# Patient Record
Sex: Female | Born: 1990
Health system: Southern US, Community
[De-identification: ages and names within clinical notes are randomized; demographics above are authoritative.]

---
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
## 2009-08-30 ENCOUNTER — Emergency Department (HOSPITAL_COMMUNITY): Admission: EM | Admit: 2009-08-30 | Discharge: 2009-08-31 | Payer: Self-pay | Admitting: Emergency Medicine

## 2009-08-30 ENCOUNTER — Encounter: Payer: Self-pay | Admitting: Orthopedic Surgery

## 2009-09-02 ENCOUNTER — Ambulatory Visit: Payer: Self-pay | Admitting: Orthopedic Surgery

## 2009-09-02 DIAGNOSIS — S93409A Sprain of unspecified ligament of unspecified ankle, initial encounter: Secondary | ICD-10-CM | POA: Insufficient documentation

## 2009-09-02 DIAGNOSIS — J45909 Unspecified asthma, uncomplicated: Secondary | ICD-10-CM | POA: Insufficient documentation

## 2010-04-21 IMAGING — CR DG ANKLE COMPLETE 3+V*R*
3 series · 3 of 3 positions shown · non-contrast
Comparison: None

CLINICAL DATA: Status post fall, with twisting injury to right
ankle; anterior and lateral right ankle pain.

RIGHT ANKLE - COMPLETE 3+ VIEW

[view not recorded (1 of 3)]
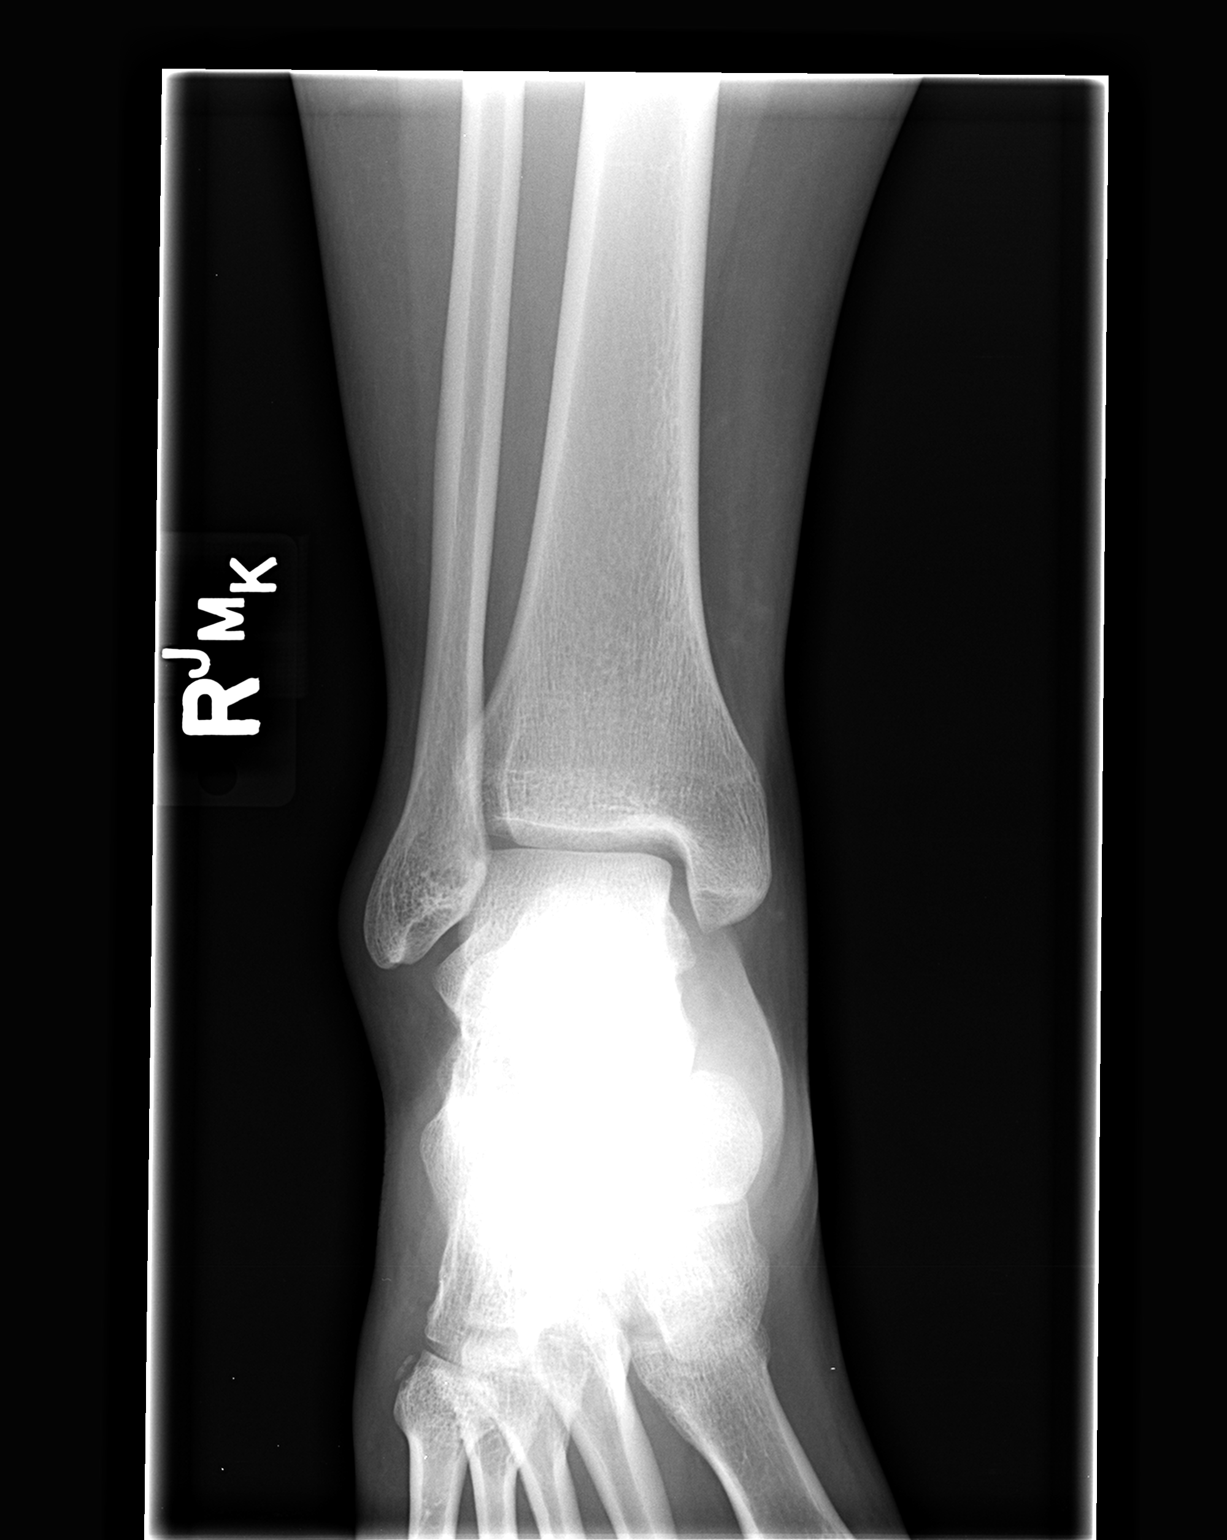

[view not recorded (2 of 3)]
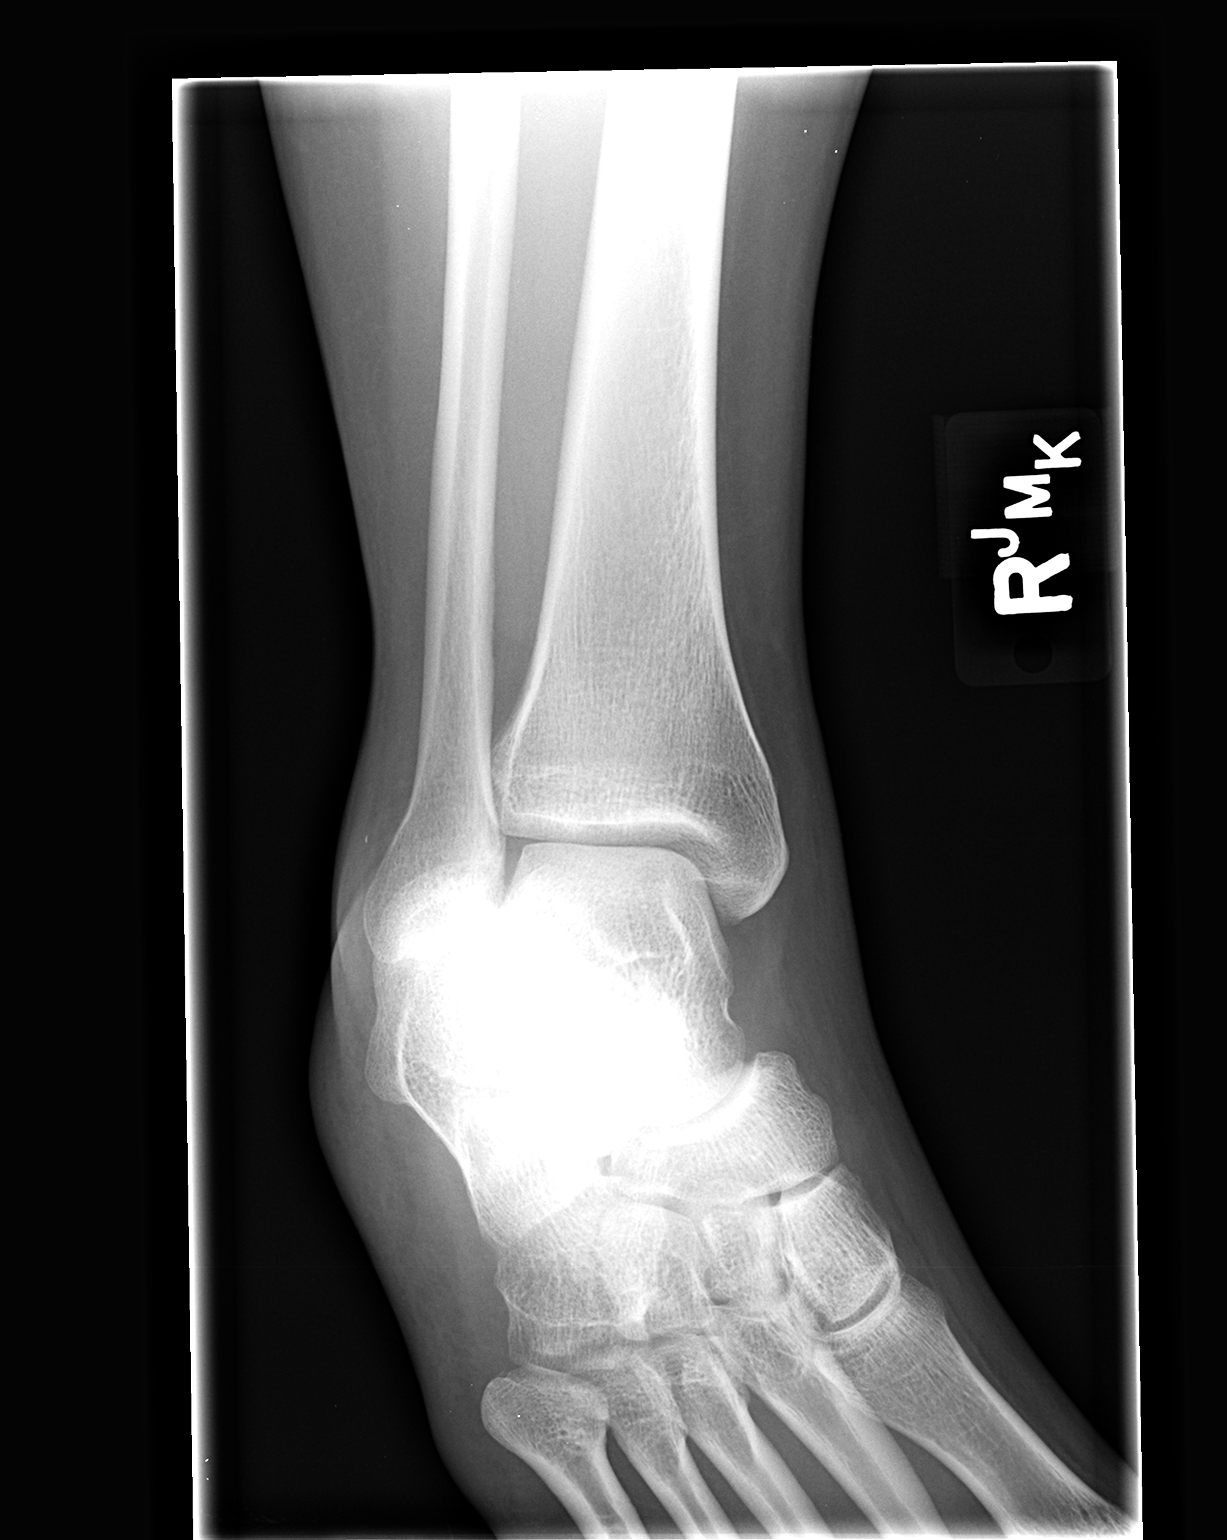

[view not recorded (3 of 3)]
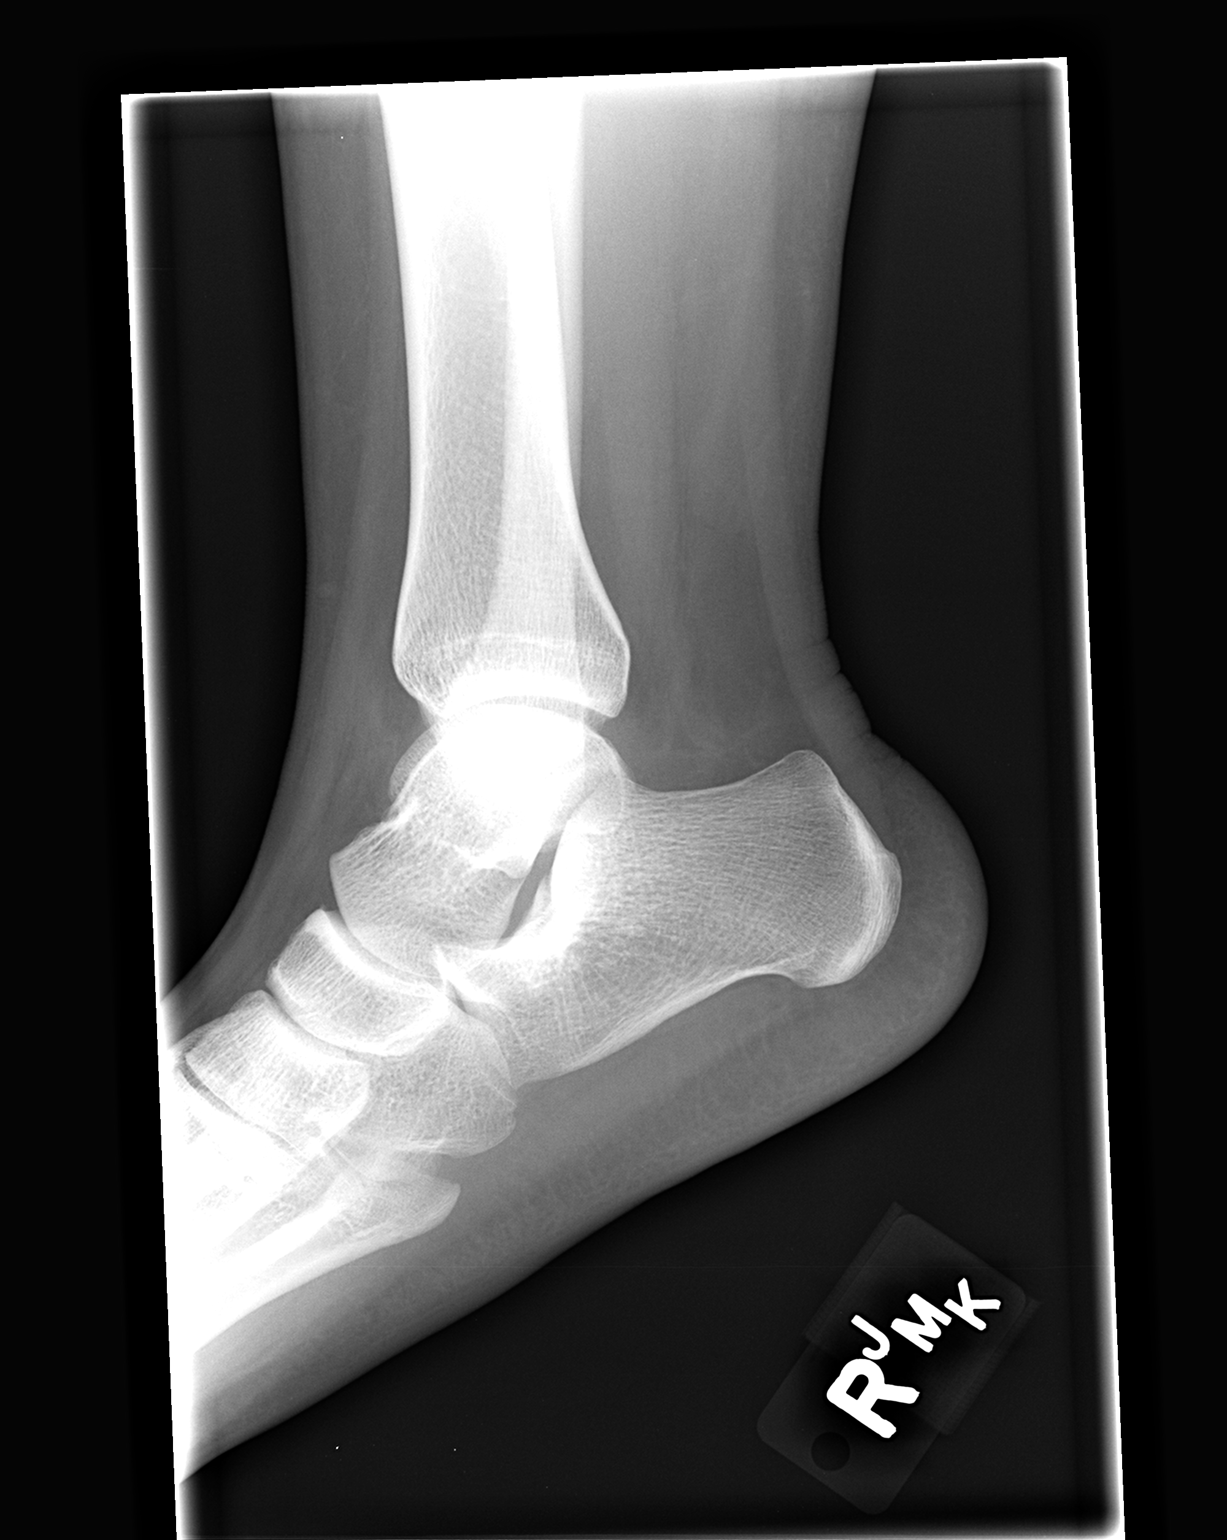

[3 of 3 positions shown; findings below may reference images not displayed]

FINDINGS: There is no evidence of acute fracture or dislocation.
The ankle mortise is intact; the interosseous space is within
normal limits.  No talar tilt or subluxation is seen. A small
osseous fragment noted at the lateral aspect of the base of the
fifth metatarsal likely reflects prior injury.

The joint spaces are preserved.  No significant soft tissue
abnormalities are seen.
IMPRESSION: No evidence of acute fracture or dislocation.

## 2018-03-20 DIAGNOSIS — Z6837 Body mass index (BMI) 37.0-37.9, adult: Secondary | ICD-10-CM | POA: Diagnosis not present

## 2018-03-20 DIAGNOSIS — J060 Acute laryngopharyngitis: Secondary | ICD-10-CM | POA: Diagnosis not present

## 2018-07-23 DIAGNOSIS — J029 Acute pharyngitis, unspecified: Secondary | ICD-10-CM | POA: Diagnosis not present

## 2018-07-23 DIAGNOSIS — J060 Acute laryngopharyngitis: Secondary | ICD-10-CM | POA: Diagnosis not present

## 2018-07-23 DIAGNOSIS — Z6837 Body mass index (BMI) 37.0-37.9, adult: Secondary | ICD-10-CM | POA: Diagnosis not present

## 2018-10-23 DIAGNOSIS — Z01419 Encounter for gynecological examination (general) (routine) without abnormal findings: Secondary | ICD-10-CM | POA: Diagnosis not present

## 2018-10-23 DIAGNOSIS — Z6841 Body Mass Index (BMI) 40.0 and over, adult: Secondary | ICD-10-CM | POA: Diagnosis not present

## 2018-10-23 DIAGNOSIS — N871 Moderate cervical dysplasia: Secondary | ICD-10-CM | POA: Diagnosis not present

## 2019-01-01 DIAGNOSIS — R112 Nausea with vomiting, unspecified: Secondary | ICD-10-CM | POA: Diagnosis not present

## 2019-01-01 DIAGNOSIS — K529 Noninfective gastroenteritis and colitis, unspecified: Secondary | ICD-10-CM | POA: Diagnosis not present

## 2019-01-01 DIAGNOSIS — R509 Fever, unspecified: Secondary | ICD-10-CM | POA: Diagnosis not present

## 2019-01-25 DIAGNOSIS — J069 Acute upper respiratory infection, unspecified: Secondary | ICD-10-CM | POA: Diagnosis not present

## 2019-05-01 DIAGNOSIS — L258 Unspecified contact dermatitis due to other agents: Secondary | ICD-10-CM | POA: Diagnosis not present

## 2019-09-30 DIAGNOSIS — R519 Headache, unspecified: Secondary | ICD-10-CM | POA: Diagnosis not present

## 2019-10-30 DIAGNOSIS — E7800 Pure hypercholesterolemia, unspecified: Secondary | ICD-10-CM | POA: Diagnosis not present

## 2019-10-30 DIAGNOSIS — R7303 Prediabetes: Secondary | ICD-10-CM | POA: Diagnosis not present

## 2019-10-30 DIAGNOSIS — E559 Vitamin D deficiency, unspecified: Secondary | ICD-10-CM | POA: Diagnosis not present

## 2019-11-05 DIAGNOSIS — R7303 Prediabetes: Secondary | ICD-10-CM | POA: Diagnosis not present

## 2019-11-05 DIAGNOSIS — E7800 Pure hypercholesterolemia, unspecified: Secondary | ICD-10-CM | POA: Diagnosis not present

## 2019-11-05 DIAGNOSIS — Z1339 Encounter for screening examination for other mental health and behavioral disorders: Secondary | ICD-10-CM | POA: Diagnosis not present

## 2019-11-05 DIAGNOSIS — E559 Vitamin D deficiency, unspecified: Secondary | ICD-10-CM | POA: Diagnosis not present

## 2019-11-05 DIAGNOSIS — Z6841 Body Mass Index (BMI) 40.0 and over, adult: Secondary | ICD-10-CM | POA: Diagnosis not present

## 2019-11-05 DIAGNOSIS — Z1331 Encounter for screening for depression: Secondary | ICD-10-CM | POA: Diagnosis not present

## 2019-11-26 DIAGNOSIS — Z6837 Body mass index (BMI) 37.0-37.9, adult: Secondary | ICD-10-CM | POA: Diagnosis not present

## 2019-11-26 DIAGNOSIS — J029 Acute pharyngitis, unspecified: Secondary | ICD-10-CM | POA: Diagnosis not present

## 2019-11-26 DIAGNOSIS — J069 Acute upper respiratory infection, unspecified: Secondary | ICD-10-CM | POA: Diagnosis not present

## 2019-11-26 DIAGNOSIS — J060 Acute laryngopharyngitis: Secondary | ICD-10-CM | POA: Diagnosis not present

## 2019-12-03 DIAGNOSIS — Z0001 Encounter for general adult medical examination with abnormal findings: Secondary | ICD-10-CM | POA: Diagnosis not present

## 2019-12-03 DIAGNOSIS — F908 Attention-deficit hyperactivity disorder, other type: Secondary | ICD-10-CM | POA: Diagnosis not present

## 2019-12-03 DIAGNOSIS — E669 Obesity, unspecified: Secondary | ICD-10-CM | POA: Diagnosis not present

## 2019-12-03 DIAGNOSIS — Z6838 Body mass index (BMI) 38.0-38.9, adult: Secondary | ICD-10-CM | POA: Diagnosis not present

## 2019-12-04 DIAGNOSIS — Z01419 Encounter for gynecological examination (general) (routine) without abnormal findings: Secondary | ICD-10-CM | POA: Diagnosis not present

## 2019-12-04 DIAGNOSIS — N871 Moderate cervical dysplasia: Secondary | ICD-10-CM | POA: Diagnosis not present

## 2019-12-04 DIAGNOSIS — Z6841 Body Mass Index (BMI) 40.0 and over, adult: Secondary | ICD-10-CM | POA: Diagnosis not present

## 2020-01-07 DIAGNOSIS — F908 Attention-deficit hyperactivity disorder, other type: Secondary | ICD-10-CM | POA: Diagnosis not present

## 2020-01-07 DIAGNOSIS — F411 Generalized anxiety disorder: Secondary | ICD-10-CM | POA: Diagnosis not present

## 2020-02-15 ENCOUNTER — Ambulatory Visit: Payer: Self-pay | Attending: Internal Medicine

## 2020-02-15 DIAGNOSIS — Z23 Encounter for immunization: Secondary | ICD-10-CM

## 2020-02-15 NOTE — Progress Notes (Signed)
   Covid-19 Vaccination Clinic  Name:  Connie Best    MRN: [id number] DOB: April 23, 1991  02/15/2020  [name] was observed post Covid-19 immunization for 15 minutes without incident. She was provided with Vaccine Information Sheet and instruction to access the V-Safe system.   [name] was instructed to call 911 with any severe reactions post vaccine: Marland Kitchen Difficulty breathing  . Swelling of face and throat  . A fast heartbeat  . A bad rash all over body  . Dizziness and weakness   Immunizations Administered    Name Date Dose VIS Date Route   Pfizer COVID-19 Vaccine 02/15/2020  8:33 AM 0.3 mL 10/25/2019 Intramuscular   Manufacturer: [employer], [employer]   Lot: [id number]   NDC: [address]-2

## 2020-03-11 ENCOUNTER — Ambulatory Visit: Payer: Self-pay | Attending: Internal Medicine

## 2020-03-11 DIAGNOSIS — Z23 Encounter for immunization: Secondary | ICD-10-CM

## 2020-03-11 NOTE — Progress Notes (Signed)
   Covid-19 Vaccination Clinic  Name:  OTHELL DILUZIO    MRN: [id number] DOB: Oct 08, 1991  03/11/2020  Ms. Maeda was observed post Covid-19 immunization for 15 minutes without incident. She was provided with Vaccine Information Sheet and instruction to access the V-Safe system.   Ms. Hollingshead was instructed to call 911 with any severe reactions post vaccine: Marland Kitchen Difficulty breathing  . Swelling of face and throat  . A fast heartbeat  . A bad rash all over body  . Dizziness and weakness   Immunizations Administered    Name Date Dose VIS Date Route   Pfizer COVID-19 Vaccine 03/11/2020  8:11 AM 0.3 mL 01/08/2019 Intramuscular   Manufacturer: [employer], [employer]   Lot: [id number]   NDC: [address]-7      Covid-19 Vaccination Clinic  Name:  ZARYAH SECKEL    MRN: [id number] DOB: 13-Aug-1991  03/11/2020  Ms. Bebee was observed post Covid-19 immunization for 15 minutes without incident. She was provided with Vaccine Information Sheet and instruction to access the V-Safe system.   Ms. Kadar was instructed to call 911 with any severe reactions post vaccine: Marland Kitchen Difficulty breathing  . Swelling of face and throat  . A fast heartbeat  . A bad rash all over body  . Dizziness and weakness   Immunizations Administered    Name Date Dose VIS Date Route   Pfizer COVID-19 Vaccine 03/11/2020  8:11 AM 0.3 mL 01/08/2019 Intramuscular   Manufacturer: [employer], [employer]   Lot: [id number]   NDC: [address]-3

## 2020-04-07 DIAGNOSIS — F908 Attention-deficit hyperactivity disorder, other type: Secondary | ICD-10-CM | POA: Diagnosis not present

## 2020-06-30 DIAGNOSIS — F908 Attention-deficit hyperactivity disorder, other type: Secondary | ICD-10-CM | POA: Diagnosis not present

## 2020-12-15 DIAGNOSIS — F908 Attention-deficit hyperactivity disorder, other type: Secondary | ICD-10-CM | POA: Diagnosis not present

## 2020-12-15 DIAGNOSIS — F411 Generalized anxiety disorder: Secondary | ICD-10-CM | POA: Diagnosis not present

## 2020-12-23 DIAGNOSIS — Z6835 Body mass index (BMI) 35.0-35.9, adult: Secondary | ICD-10-CM | POA: Diagnosis not present

## 2020-12-23 DIAGNOSIS — Z01419 Encounter for gynecological examination (general) (routine) without abnormal findings: Secondary | ICD-10-CM | POA: Diagnosis not present

## 2021-02-16 DIAGNOSIS — R41840 Attention and concentration deficit: Secondary | ICD-10-CM | POA: Diagnosis not present

## 2021-02-16 DIAGNOSIS — F338 Other recurrent depressive disorders: Secondary | ICD-10-CM | POA: Diagnosis not present

## 2021-02-16 DIAGNOSIS — F419 Anxiety disorder, unspecified: Secondary | ICD-10-CM | POA: Diagnosis not present

## 2021-04-13 DIAGNOSIS — Z79899 Other long term (current) drug therapy: Secondary | ICD-10-CM | POA: Diagnosis not present

## 2021-04-13 DIAGNOSIS — F902 Attention-deficit hyperactivity disorder, combined type: Secondary | ICD-10-CM | POA: Diagnosis not present

## 2021-06-28 DIAGNOSIS — Z0000 Encounter for general adult medical examination without abnormal findings: Secondary | ICD-10-CM | POA: Diagnosis not present

## 2021-06-28 DIAGNOSIS — Z0001 Encounter for general adult medical examination with abnormal findings: Secondary | ICD-10-CM | POA: Diagnosis not present

## 2021-07-01 DIAGNOSIS — Z0001 Encounter for general adult medical examination with abnormal findings: Secondary | ICD-10-CM | POA: Diagnosis not present

## 2021-07-13 DIAGNOSIS — F902 Attention-deficit hyperactivity disorder, combined type: Secondary | ICD-10-CM | POA: Diagnosis not present

## 2021-07-13 DIAGNOSIS — Z79899 Other long term (current) drug therapy: Secondary | ICD-10-CM | POA: Diagnosis not present

## 2021-08-06 ENCOUNTER — Other Ambulatory Visit: Payer: Self-pay

## 2021-08-06 ENCOUNTER — Ambulatory Visit
Admission: RE | Admit: 2021-08-06 | Discharge: 2021-08-06 | Disposition: A | Discharge status: Home / Self Care | Payer: BC Managed Care – PPO | Source: Ambulatory Visit

## 2021-08-06 VITALS — BP 113/81 | HR 82 | Temp 98.1°F | Resp 18

## 2021-08-06 DIAGNOSIS — R6883 Chills (without fever): Secondary | ICD-10-CM

## 2021-08-06 DIAGNOSIS — R12 Heartburn: Secondary | ICD-10-CM

## 2021-08-06 DIAGNOSIS — Z20822 Contact with and (suspected) exposure to covid-19: Secondary | ICD-10-CM

## 2021-08-06 MED ORDER — LIDOCAINE VISCOUS HCL 2 % MT SOLN
15.0000 mL | Freq: Once | OROMUCOSAL | Status: AC
  Administered 2021-08-06: 15 mL via ORAL

## 2021-08-06 MED ORDER — ALUM & MAG HYDROXIDE-SIMETH 200-200-20 MG/5ML PO SUSP
30.0000 mL | Freq: Once | ORAL | Status: AC
  Administered 2021-08-06: 30 mL via ORAL

## 2021-08-06 NOTE — ED Provider Notes (Addendum)
[facility]    CSN: [id number] Arrival date & time: 08/06/21  1101      History   Chief Complaint Chief Complaint  Patient presents with   Abdominal Pain    HPI Connie Best is a 29 y.o. female.   Patient presents with burning in throat and epigastric area that started yesterday after eating [employer] fried chicken and sweet tea.  States that she took Pepcid and "antacid Gummies" that relieved symptoms temporarily.  Then, burning that went up throat started again last night.  Denies nausea but patient states that she "forced herself to vomit" to relieve the burning sensation.  Sensation is still present per patient.  Denies chest pain or shortness of breath.  Denies any lower abdominal pain or diarrhea.  Denies noticing any blood in the vomit.  Also having some chills that started yesterday as well.  Denies any fevers or known sick contacts.  Does have history of heartburn.  No cardiac history.   Abdominal Pain  History reviewed. No pertinent past medical history.  Patient Active Problem List   Diagnosis Date Noted   ASTHMA 09/02/2009   ANKLE SPRAIN 09/02/2009    History reviewed. No pertinent surgical history.  OB History   No obstetric history on file.      Home Medications    Prior to Admission medications   Medication Sig Start Date End Date Taking? Authorizing Provider  norethindrone-ethinyl estradiol (LOESTRIN) 1-20 MG-MCG tablet Take 1 tablet by mouth daily. 07/20/21   [provider]  VYVANSE 30 MG capsule Take 30 mg by mouth 2 (two) times daily. 07/14/21   [provider]    Family History History reviewed. No pertinent family history.  Social History Social History   Tobacco Use   Smoking status: Never   Smokeless tobacco: Never     Allergies   Neomycin-bacitracin zn-polymyx   Review of Systems Review of Systems Per HPI  Physical Exam Triage Vital Signs ED Triage Vitals  Enc Vitals Group     BP 08/06/21  1126 113/81     Pulse Rate 08/06/21 1126 82     Resp 08/06/21 1126 18     Temp 08/06/21 1126 98.1 F (36.7 C)     Temp Source 08/06/21 1126 Oral     SpO2 08/06/21 1126 97 %     Weight --      Height --      Head Circumference --      Peak Flow --      Pain Score 08/06/21 1127 5     Pain Loc --      Pain Edu? --      Excl. in GC? --    No data found.  Updated Vital Signs BP 113/81 (BP Location: Left Arm)   Pulse 82   Temp 98.1 F (36.7 C) (Oral)   Resp 18   SpO2 97%   Visual Acuity Right Eye Distance:   Left Eye Distance:   Bilateral Distance:    Right Eye Near:   Left Eye Near:    Bilateral Near:     Physical Exam Constitutional:      General: She is not in acute distress.    Appearance: Normal appearance. She is not ill-appearing, toxic-appearing or diaphoretic.  HENT:     Head: Normocephalic and atraumatic.     Mouth/Throat:     Mouth: Mucous membranes are moist.     Pharynx: No posterior oropharyngeal erythema.  Eyes:  Extraocular Movements: Extraocular movements intact.     Conjunctiva/sclera: Conjunctivae normal.  Cardiovascular:     Rate and Rhythm: Normal rate and regular rhythm.     Heart sounds: Normal heart sounds.  Pulmonary:     Effort: Pulmonary effort is normal. No respiratory distress.     Breath sounds: Normal breath sounds. No stridor. No wheezing or rhonchi.  Abdominal:     General: Bowel sounds are normal. There is no distension.     Palpations: Abdomen is soft.     Tenderness: There is no abdominal tenderness.  Skin:    General: Skin is warm and dry.  Neurological:     General: No focal deficit present.     Mental Status: She is alert and oriented to person, place, and time. Mental status is at baseline.  Psychiatric:        Mood and Affect: Mood normal.        Behavior: Behavior normal.        Thought Content: Thought content normal.        Judgment: Judgment normal.     UC Treatments / Results  Labs (all labs ordered are  listed, but only abnormal results are displayed) Labs Reviewed  NOVEL CORONAVIRUS, NAA    EKG   Radiology No results found.  Procedures Procedures (including critical care time)  Medications Ordered in UC Medications  alum & mag hydroxide-simeth (MAALOX/MYLANTA) 200-200-20 MG/5ML suspension 30 mL (has no administration in time range)    And  lidocaine (XYLOCAINE) 2 % viscous mouth solution 15 mL (has no administration in time range)    Initial Impression / Assessment and Plan / UC Course  I have reviewed the triage vital signs and the nursing notes.  Pertinent labs & imaging results that were available during my care of the patient were reviewed by me and considered in my medical decision making (see chart for details).     Patient symptoms and physical exam are most consistent with heartburn flareup.  Will treat with GI cocktail.  Discussed over-the-counter acid reflux medications that patient can also take.  No red flags seen on exam.  Little to no suspicion of cardiac issues causing patient's symptoms.  Vital signs are stable.  Symptoms seem pretty consistent and typical for heartburn related symptoms.  Will do COVID-19 test due to patient experiencing chills yesterday.  Covid 19 PCR pending.Discussed strict return precautions. Patient verbalized understanding and is agreeable with plan.  Final Clinical Impressions(s) / UC Diagnoses   Final diagnoses:  Encounter for laboratory testing for COVID-19 virus  Heartburn  Chills (without fever)     Discharge Instructions      It is suspected that you have a flareup of your heartburn.  GI cocktail was administered today to help with your pain.  You may continue Pepcid as needed.  COVID-19 test is pending.  We will call if it is positive.  Please go to the hospital if symptoms significantly worsen.     ED Prescriptions   None    PDMP not reviewed this encounter.   Lance Muss, FNP 08/06/21 1156    Lance Muss, FNP 08/06/21 1157

## 2021-08-06 NOTE — Discharge Instructions (Addendum)
It is suspected that you have a flareup of your heartburn.  GI cocktail was administered today to help with your pain.  You may continue Pepcid as needed.  COVID-19 test is pending.  We will call if it is positive.  Please go to the hospital if symptoms significantly worsen.

## 2021-08-06 NOTE — ED Triage Notes (Signed)
Pt c/o headache onset yesterday at work. After work pt had [employer]jangles which she thinks caused acid reflux. States she tried antacid which relieved symptoms. In the night pt states she felt burning in her throat and she successfully tried making herself vomit.   Also c/o chills. Now symptom is abdomina pain. 5/10 burning discomfort.

## 2021-08-07 LAB — NOVEL CORONAVIRUS, NAA: SARS-CoV-2, NAA: NOT DETECTED

## 2021-08-07 LAB — SARS-COV-2, NAA 2 DAY TAT

## 2021-10-13 DIAGNOSIS — F902 Attention-deficit hyperactivity disorder, combined type: Secondary | ICD-10-CM | POA: Diagnosis not present

## 2021-10-13 DIAGNOSIS — F419 Anxiety disorder, unspecified: Secondary | ICD-10-CM | POA: Diagnosis not present

## 2021-10-13 DIAGNOSIS — Z79899 Other long term (current) drug therapy: Secondary | ICD-10-CM | POA: Diagnosis not present

## 2021-10-13 DIAGNOSIS — F338 Other recurrent depressive disorders: Secondary | ICD-10-CM | POA: Diagnosis not present

## 2022-12-07 ENCOUNTER — Other Ambulatory Visit (HOSPITAL_COMMUNITY): Payer: Self-pay

## 2022-12-07 MED ORDER — LISDEXAMFETAMINE DIMESYLATE 30 MG PO CAPS
30.0000 mg | ORAL_CAPSULE | Freq: Two times a day (BID) | ORAL | 0 refills | Status: DC
  Filled 2022-12-07: qty 60, 30d supply, fill #0

## 2023-01-02 ENCOUNTER — Other Ambulatory Visit (HOSPITAL_COMMUNITY): Payer: Self-pay

## 2023-01-02 MED ORDER — VYVANSE 30 MG PO CAPS
30.0000 mg | ORAL_CAPSULE | Freq: Two times a day (BID) | ORAL | 0 refills | Status: AC
  Filled 2023-01-02 – 2023-01-04 (×2): qty 60, 30d supply, fill #0

## 2023-01-02 MED ORDER — LISDEXAMFETAMINE DIMESYLATE 30 MG PO CAPS
30.0000 mg | ORAL_CAPSULE | Freq: Two times a day (BID) | ORAL | 0 refills | Status: AC
  Filled 2023-01-02: qty 60, 30d supply, fill #0

## 2023-01-03 ENCOUNTER — Other Ambulatory Visit (HOSPITAL_COMMUNITY): Payer: Self-pay

## 2023-01-04 ENCOUNTER — Other Ambulatory Visit (HOSPITAL_COMMUNITY): Payer: Self-pay

## 2023-01-31 ENCOUNTER — Other Ambulatory Visit (HOSPITAL_COMMUNITY): Payer: Self-pay

## 2023-01-31 MED ORDER — VYVANSE 30 MG PO CAPS: 30.0000 mg | ORAL_CAPSULE | Freq: Two times a day (BID) | ORAL | 0 refills | Status: AC

## 2023-02-06 ENCOUNTER — Other Ambulatory Visit (HOSPITAL_COMMUNITY): Payer: Self-pay

## 2023-02-06 MED ORDER — VYVANSE 30 MG PO CAPS
30.0000 mg | ORAL_CAPSULE | Freq: Two times a day (BID) | ORAL | 0 refills | Status: AC
  Filled 2023-02-06: qty 60, 30d supply, fill #0

## 2023-03-06 ENCOUNTER — Other Ambulatory Visit (HOSPITAL_COMMUNITY): Payer: Self-pay

## 2023-03-06 MED ORDER — VYVANSE 30 MG PO CAPS
30.0000 mg | ORAL_CAPSULE | Freq: Two times a day (BID) | ORAL | 0 refills | Status: AC
  Filled 2023-03-06: qty 60, 30d supply, fill #0

## 2023-04-05 ENCOUNTER — Other Ambulatory Visit (HOSPITAL_COMMUNITY): Payer: Self-pay

## 2023-04-05 MED ORDER — VYVANSE 30 MG PO CAPS
30.0000 mg | ORAL_CAPSULE | Freq: Two times a day (BID) | ORAL | 0 refills | Status: AC
  Filled 2023-04-05: qty 60, 30d supply, fill #0

## 2023-05-03 ENCOUNTER — Other Ambulatory Visit (HOSPITAL_COMMUNITY): Payer: Self-pay

## 2023-05-03 MED ORDER — VYVANSE 30 MG PO CAPS: 30.0000 mg | ORAL_CAPSULE | Freq: Two times a day (BID) | ORAL | 0 refills | Status: AC

## 2023-05-15 ENCOUNTER — Other Ambulatory Visit (HOSPITAL_COMMUNITY): Payer: Self-pay

## 2023-05-15 MED ORDER — VYVANSE 30 MG PO CAPS
30.0000 mg | ORAL_CAPSULE | Freq: Two times a day (BID) | ORAL | 0 refills | Status: AC
  Filled 2023-05-15: qty 60, 30d supply, fill #0

## 2023-05-17 ENCOUNTER — Other Ambulatory Visit (HOSPITAL_COMMUNITY): Payer: Self-pay

## 2023-06-12 ENCOUNTER — Other Ambulatory Visit (HOSPITAL_COMMUNITY): Payer: Self-pay

## 2023-06-12 MED ORDER — VYVANSE 30 MG PO CAPS
30.0000 mg | ORAL_CAPSULE | Freq: Two times a day (BID) | ORAL | 0 refills | Status: AC
  Filled 2023-06-19: qty 60, 30d supply, fill #0

## 2023-06-19 ENCOUNTER — Other Ambulatory Visit (HOSPITAL_COMMUNITY): Payer: Self-pay

## 2023-07-14 ENCOUNTER — Other Ambulatory Visit (HOSPITAL_COMMUNITY): Payer: Self-pay

## 2023-07-14 MED ORDER — VYVANSE 30 MG PO CAPS
30.0000 mg | ORAL_CAPSULE | Freq: Two times a day (BID) | ORAL | 0 refills | Status: AC
  Filled 2023-07-14: qty 60, 30d supply, fill #0

## 2023-07-19 ENCOUNTER — Other Ambulatory Visit (HOSPITAL_COMMUNITY): Payer: Self-pay

## 2023-08-14 ENCOUNTER — Other Ambulatory Visit (HOSPITAL_COMMUNITY): Payer: Self-pay

## 2023-08-14 MED ORDER — VYVANSE 30 MG PO CAPS
30.0000 mg | ORAL_CAPSULE | Freq: Two times a day (BID) | ORAL | 0 refills | Status: AC
  Filled 2023-08-14: qty 60, 30d supply, fill #0

## 2023-09-12 ENCOUNTER — Other Ambulatory Visit (HOSPITAL_COMMUNITY): Payer: Self-pay

## 2023-09-12 MED ORDER — VYVANSE 30 MG PO CAPS
30.0000 mg | ORAL_CAPSULE | Freq: Two times a day (BID) | ORAL | 0 refills | Status: AC
  Filled 2023-09-12: qty 60, 30d supply, fill #0

## 2023-09-13 ENCOUNTER — Other Ambulatory Visit (HOSPITAL_COMMUNITY): Payer: Self-pay

## 2023-10-15 ENCOUNTER — Other Ambulatory Visit (HOSPITAL_COMMUNITY): Payer: Self-pay

## 2023-10-16 ENCOUNTER — Other Ambulatory Visit (HOSPITAL_COMMUNITY): Payer: Self-pay

## 2023-10-16 MED ORDER — VYVANSE 30 MG PO CAPS
30.0000 mg | ORAL_CAPSULE | Freq: Two times a day (BID) | ORAL | 0 refills | Status: DC
  Filled 2023-10-16: qty 60, 30d supply, fill #0

## 2023-10-18 ENCOUNTER — Other Ambulatory Visit (HOSPITAL_COMMUNITY): Payer: Self-pay

## 2023-11-13 ENCOUNTER — Other Ambulatory Visit (HOSPITAL_COMMUNITY): Payer: Self-pay

## 2023-11-13 MED ORDER — VYVANSE 30 MG PO CAPS
30.0000 mg | ORAL_CAPSULE | Freq: Two times a day (BID) | ORAL | 0 refills | Status: DC
  Filled 2023-11-13 – 2023-11-21 (×2): qty 60, 30d supply, fill #0

## 2023-11-21 ENCOUNTER — Other Ambulatory Visit (HOSPITAL_COMMUNITY): Payer: Self-pay

## 2023-11-22 ENCOUNTER — Other Ambulatory Visit (HOSPITAL_COMMUNITY): Payer: Self-pay

## 2023-12-18 ENCOUNTER — Other Ambulatory Visit (HOSPITAL_COMMUNITY): Payer: Self-pay

## 2023-12-18 MED ORDER — VYVANSE 30 MG PO CAPS
30.0000 mg | ORAL_CAPSULE | Freq: Two times a day (BID) | ORAL | 0 refills | Status: DC
  Filled 2023-12-18 – 2023-12-20 (×2): qty 60, 30d supply, fill #0

## 2023-12-20 ENCOUNTER — Other Ambulatory Visit (HOSPITAL_COMMUNITY): Payer: Self-pay

## 2023-12-21 ENCOUNTER — Other Ambulatory Visit (HOSPITAL_COMMUNITY): Payer: Self-pay

## 2024-01-15 ENCOUNTER — Other Ambulatory Visit (HOSPITAL_COMMUNITY): Payer: Self-pay

## 2024-01-15 MED ORDER — VYVANSE 30 MG PO CAPS
30.0000 mg | ORAL_CAPSULE | Freq: Two times a day (BID) | ORAL | 0 refills | Status: DC
  Filled 2024-01-15 – 2024-01-19 (×2): qty 60, 30d supply, fill #0

## 2024-01-18 ENCOUNTER — Other Ambulatory Visit (HOSPITAL_COMMUNITY): Payer: Self-pay

## 2024-01-19 ENCOUNTER — Other Ambulatory Visit (HOSPITAL_COMMUNITY): Payer: Self-pay

## 2024-02-19 ENCOUNTER — Other Ambulatory Visit (HOSPITAL_COMMUNITY): Payer: Self-pay

## 2024-02-19 MED ORDER — VYVANSE 30 MG PO CAPS
30.0000 mg | ORAL_CAPSULE | Freq: Two times a day (BID) | ORAL | 0 refills | Status: DC
  Filled 2024-02-19 – 2024-02-20 (×2): qty 60, 30d supply, fill #0

## 2024-02-20 ENCOUNTER — Other Ambulatory Visit (HOSPITAL_COMMUNITY): Payer: Self-pay

## 2024-03-19 ENCOUNTER — Other Ambulatory Visit (HOSPITAL_COMMUNITY): Payer: Self-pay

## 2024-03-20 ENCOUNTER — Other Ambulatory Visit (HOSPITAL_COMMUNITY): Payer: Self-pay

## 2024-03-20 MED ORDER — VYVANSE 30 MG PO CAPS
30.0000 mg | ORAL_CAPSULE | Freq: Two times a day (BID) | ORAL | 0 refills | Status: DC
  Filled 2024-03-20: qty 60, 30d supply, fill #0

## 2024-03-23 ENCOUNTER — Other Ambulatory Visit (HOSPITAL_COMMUNITY): Payer: Self-pay

## 2024-04-21 ENCOUNTER — Other Ambulatory Visit (HOSPITAL_COMMUNITY): Payer: Self-pay

## 2024-04-22 ENCOUNTER — Other Ambulatory Visit (HOSPITAL_COMMUNITY): Payer: Self-pay

## 2024-04-22 MED ORDER — VYVANSE 30 MG PO CAPS
30.0000 mg | ORAL_CAPSULE | Freq: Two times a day (BID) | ORAL | 0 refills | Status: DC
  Filled 2024-04-22: qty 60, 30d supply, fill #0

## 2024-05-20 ENCOUNTER — Other Ambulatory Visit (HOSPITAL_COMMUNITY): Payer: Self-pay

## 2024-05-20 MED ORDER — VYVANSE 30 MG PO CAPS
30.0000 mg | ORAL_CAPSULE | Freq: Two times a day (BID) | ORAL | 0 refills | Status: DC
  Filled 2024-05-20 – 2024-05-21 (×2): qty 60, 30d supply, fill #0

## 2024-05-21 ENCOUNTER — Other Ambulatory Visit (HOSPITAL_COMMUNITY): Payer: Self-pay

## 2024-06-21 ENCOUNTER — Other Ambulatory Visit (HOSPITAL_COMMUNITY): Payer: Self-pay

## 2024-06-21 ENCOUNTER — Other Ambulatory Visit: Payer: Self-pay

## 2024-06-21 MED ORDER — VYVANSE 30 MG PO CAPS
30.0000 mg | ORAL_CAPSULE | Freq: Two times a day (BID) | ORAL | 0 refills | Status: DC
  Filled 2024-06-21: qty 60, 30d supply, fill #0

## 2024-07-22 ENCOUNTER — Other Ambulatory Visit (HOSPITAL_COMMUNITY): Payer: Self-pay

## 2024-07-22 MED ORDER — VYVANSE 30 MG PO CAPS
30.0000 mg | ORAL_CAPSULE | Freq: Two times a day (BID) | ORAL | 0 refills | Status: DC
  Filled 2024-07-22 – 2024-07-23 (×2): qty 60, 30d supply, fill #0

## 2024-07-23 ENCOUNTER — Other Ambulatory Visit (HOSPITAL_COMMUNITY): Payer: Self-pay

## 2024-07-24 ENCOUNTER — Other Ambulatory Visit (HOSPITAL_COMMUNITY): Payer: Self-pay

## 2024-08-21 ENCOUNTER — Other Ambulatory Visit (HOSPITAL_COMMUNITY): Payer: Self-pay

## 2024-08-21 MED ORDER — VYVANSE 30 MG PO CAPS
30.0000 mg | ORAL_CAPSULE | Freq: Two times a day (BID) | ORAL | 0 refills | Status: DC
  Filled 2024-08-21: qty 60, 30d supply, fill #0

## 2024-08-26 ENCOUNTER — Other Ambulatory Visit (HOSPITAL_COMMUNITY): Payer: Self-pay

## 2024-08-26 MED ORDER — LISDEXAMFETAMINE DIMESYLATE 60 MG PO CAPS
60.0000 mg | ORAL_CAPSULE | Freq: Every day | ORAL | 0 refills | Status: DC
  Filled 2024-08-26: qty 30, 30d supply, fill #0

## 2024-09-23 ENCOUNTER — Other Ambulatory Visit (HOSPITAL_COMMUNITY): Payer: Self-pay

## 2024-09-23 MED ORDER — LISDEXAMFETAMINE DIMESYLATE 60 MG PO CAPS
60.0000 mg | ORAL_CAPSULE | Freq: Every day | ORAL | 0 refills | Status: DC
  Filled 2024-09-23 – 2024-09-24 (×2): qty 30, 30d supply, fill #0

## 2024-09-24 ENCOUNTER — Other Ambulatory Visit (HOSPITAL_COMMUNITY): Payer: Self-pay

## 2024-09-30 ENCOUNTER — Other Ambulatory Visit (HOSPITAL_COMMUNITY): Payer: Self-pay

## 2024-09-30 MED ORDER — LISDEXAMFETAMINE DIMESYLATE 30 MG PO CAPS
30.0000 mg | ORAL_CAPSULE | Freq: Every day | ORAL | 0 refills | Status: AC
  Filled 2024-09-30 – 2024-10-01 (×2): qty 30, 30d supply, fill #0

## 2024-10-01 ENCOUNTER — Other Ambulatory Visit (HOSPITAL_COMMUNITY): Payer: Self-pay

## 2024-10-02 ENCOUNTER — Other Ambulatory Visit (HOSPITAL_COMMUNITY): Payer: Self-pay

## 2024-10-02 MED ORDER — VYVANSE 30 MG PO CAPS
30.0000 mg | ORAL_CAPSULE | Freq: Two times a day (BID) | ORAL | 0 refills | Status: AC
  Filled 2024-10-02: qty 60, 30d supply, fill #0

## 2024-10-08 ENCOUNTER — Other Ambulatory Visit (HOSPITAL_COMMUNITY): Payer: Self-pay

## 2024-11-04 ENCOUNTER — Other Ambulatory Visit (HOSPITAL_COMMUNITY): Payer: Self-pay

## 2024-11-04 MED ORDER — VYVANSE 30 MG PO CAPS: 30.0000 mg | ORAL_CAPSULE | Freq: Two times a day (BID) | ORAL | 0 refills | Status: AC

## 2024-11-05 ENCOUNTER — Other Ambulatory Visit (HOSPITAL_COMMUNITY): Payer: Self-pay

## 2024-11-12 ENCOUNTER — Other Ambulatory Visit (HOSPITAL_COMMUNITY): Payer: Self-pay

## 2024-11-12 MED ORDER — VYVANSE 30 MG PO CAPS
30.0000 mg | ORAL_CAPSULE | Freq: Two times a day (BID) | ORAL | 0 refills | Status: AC
  Filled 2024-11-12: qty 60, 30d supply, fill #0

## 2024-11-13 ENCOUNTER — Other Ambulatory Visit (HOSPITAL_COMMUNITY): Payer: Self-pay

## 2024-11-15 ENCOUNTER — Other Ambulatory Visit (HOSPITAL_COMMUNITY): Payer: Self-pay

## 2024-12-05 ENCOUNTER — Other Ambulatory Visit (HOSPITAL_COMMUNITY): Payer: Self-pay

## 2024-12-05 MED ORDER — VYVANSE 30 MG PO CAPS
30.0000 mg | ORAL_CAPSULE | Freq: Two times a day (BID) | ORAL | 0 refills | Status: DC
  Filled 2024-12-05: qty 60, 30d supply, fill #0

## 2024-12-18 ENCOUNTER — Other Ambulatory Visit (HOSPITAL_COMMUNITY): Payer: Self-pay

## 2024-12-18 MED ORDER — VYVANSE 30 MG PO CAPS
30.0000 mg | ORAL_CAPSULE | Freq: Two times a day (BID) | ORAL | 0 refills | Status: AC
  Filled 2024-12-18: qty 60, 30d supply, fill #0

## 2024-12-19 ENCOUNTER — Other Ambulatory Visit (HOSPITAL_COMMUNITY): Payer: Self-pay
